# Patient Record
Sex: Female | Born: 1950 | Race: Black or African American | Hispanic: No | Smoking: Never smoker
Health system: Southern US, Community
[De-identification: ages and names within clinical notes are randomized; demographics above are authoritative.]

## PROBLEM LIST (undated history)

## (undated) DIAGNOSIS — D219 Benign neoplasm of connective and other soft tissue, unspecified: Secondary | ICD-10-CM

## (undated) HISTORY — PX: VAGINAL HYSTERECTOMY: SUR661

## (undated) HISTORY — PX: OTHER SURGICAL HISTORY: SHX169

## (undated) HISTORY — PX: COSMETIC SURGERY: SHX468

## (undated) HISTORY — DX: Benign neoplasm of connective and other soft tissue, unspecified: D21.9

---

## 1998-10-14 ENCOUNTER — Inpatient Hospital Stay (HOSPITAL_COMMUNITY): Admission: AD | Admit: 1998-10-14 | Discharge: 1998-10-14 | Payer: Self-pay | Admitting: Family Medicine

## 1998-12-16 ENCOUNTER — Encounter (INDEPENDENT_AMBULATORY_CARE_PROVIDER_SITE_OTHER): Payer: Self-pay | Admitting: Specialist

## 1998-12-16 ENCOUNTER — Other Ambulatory Visit: Admission: RE | Admit: 1998-12-16 | Discharge: 1998-12-16 | Payer: Self-pay | Admitting: *Deleted

## 1999-05-07 ENCOUNTER — Emergency Department (HOSPITAL_COMMUNITY): Admission: EM | Admit: 1999-05-07 | Discharge: 1999-05-07 | Payer: Self-pay | Admitting: Emergency Medicine

## 1999-05-22 ENCOUNTER — Other Ambulatory Visit: Admission: RE | Admit: 1999-05-22 | Discharge: 1999-05-22 | Payer: Self-pay | Admitting: *Deleted

## 1999-11-01 ENCOUNTER — Ambulatory Visit (HOSPITAL_COMMUNITY): Admission: RE | Admit: 1999-11-01 | Discharge: 1999-11-01 | Payer: Self-pay | Admitting: *Deleted

## 1999-11-01 ENCOUNTER — Encounter: Payer: Self-pay | Admitting: *Deleted

## 2000-03-12 ENCOUNTER — Inpatient Hospital Stay (HOSPITAL_COMMUNITY): Admission: AD | Admit: 2000-03-12 | Discharge: 2000-03-12 | Payer: Self-pay | Admitting: *Deleted

## 2000-04-02 ENCOUNTER — Encounter (HOSPITAL_COMMUNITY): Admission: RE | Admit: 2000-04-02 | Discharge: 2000-07-01 | Payer: Self-pay | Admitting: *Deleted

## 2000-08-20 ENCOUNTER — Other Ambulatory Visit: Admission: RE | Admit: 2000-08-20 | Discharge: 2000-08-20 | Payer: Self-pay | Admitting: Obstetrics and Gynecology

## 2000-08-21 ENCOUNTER — Other Ambulatory Visit: Admission: RE | Admit: 2000-08-21 | Discharge: 2000-08-21 | Payer: Self-pay | Admitting: Obstetrics and Gynecology

## 2000-08-21 ENCOUNTER — Encounter (INDEPENDENT_AMBULATORY_CARE_PROVIDER_SITE_OTHER): Payer: Self-pay

## 2000-10-01 ENCOUNTER — Encounter (INDEPENDENT_AMBULATORY_CARE_PROVIDER_SITE_OTHER): Payer: Self-pay | Admitting: Specialist

## 2000-10-01 ENCOUNTER — Inpatient Hospital Stay (HOSPITAL_COMMUNITY): Admission: RE | Admit: 2000-10-01 | Discharge: 2000-10-02 | Payer: Self-pay | Admitting: Obstetrics and Gynecology

## 2000-11-18 ENCOUNTER — Encounter: Admission: RE | Admit: 2000-11-18 | Discharge: 2000-11-18 | Payer: Self-pay | Admitting: Obstetrics and Gynecology

## 2000-11-18 ENCOUNTER — Encounter: Payer: Self-pay | Admitting: Obstetrics and Gynecology

## 2001-01-22 ENCOUNTER — Other Ambulatory Visit: Admission: RE | Admit: 2001-01-22 | Discharge: 2001-01-22 | Payer: Self-pay | Admitting: Obstetrics and Gynecology

## 2001-11-21 ENCOUNTER — Encounter: Payer: Self-pay | Admitting: Obstetrics and Gynecology

## 2001-11-21 ENCOUNTER — Ambulatory Visit (HOSPITAL_COMMUNITY): Admission: RE | Admit: 2001-11-21 | Discharge: 2001-11-21 | Payer: Self-pay | Admitting: Obstetrics and Gynecology

## 2002-09-17 ENCOUNTER — Other Ambulatory Visit: Admission: RE | Admit: 2002-09-17 | Discharge: 2002-09-17 | Payer: Self-pay | Admitting: *Deleted

## 2003-09-17 ENCOUNTER — Encounter: Admission: RE | Admit: 2003-09-17 | Discharge: 2003-09-17 | Payer: Self-pay | Admitting: Obstetrics and Gynecology

## 2004-06-19 ENCOUNTER — Other Ambulatory Visit: Admission: RE | Admit: 2004-06-19 | Discharge: 2004-06-19 | Payer: Self-pay | Admitting: Obstetrics and Gynecology

## 2004-09-20 ENCOUNTER — Encounter: Admission: RE | Admit: 2004-09-20 | Discharge: 2004-09-20 | Payer: Self-pay | Admitting: Obstetrics and Gynecology

## 2004-09-26 ENCOUNTER — Encounter: Admission: RE | Admit: 2004-09-26 | Discharge: 2004-09-26 | Payer: Self-pay | Admitting: Obstetrics and Gynecology

## 2005-11-28 ENCOUNTER — Encounter: Admission: RE | Admit: 2005-11-28 | Discharge: 2005-11-28 | Payer: Self-pay | Admitting: Obstetrics and Gynecology

## 2005-12-27 ENCOUNTER — Other Ambulatory Visit: Admission: RE | Admit: 2005-12-27 | Discharge: 2005-12-27 | Payer: Self-pay | Admitting: Obstetrics and Gynecology

## 2006-04-24 ENCOUNTER — Encounter: Admission: RE | Admit: 2006-04-24 | Discharge: 2006-04-24 | Payer: Self-pay | Admitting: Family Medicine

## 2006-12-04 ENCOUNTER — Encounter: Admission: RE | Admit: 2006-12-04 | Discharge: 2006-12-04 | Payer: Self-pay | Admitting: Obstetrics and Gynecology

## 2007-09-16 ENCOUNTER — Ambulatory Visit (HOSPITAL_COMMUNITY): Admission: RE | Admit: 2007-09-16 | Discharge: 2007-09-16 | Payer: Self-pay | Admitting: Family Medicine

## 2007-12-08 ENCOUNTER — Encounter: Admission: RE | Admit: 2007-12-08 | Discharge: 2007-12-08 | Payer: Self-pay | Admitting: Obstetrics and Gynecology

## 2008-12-23 ENCOUNTER — Encounter: Admission: RE | Admit: 2008-12-23 | Discharge: 2008-12-23 | Payer: Self-pay | Admitting: Obstetrics and Gynecology

## 2010-02-07 ENCOUNTER — Ambulatory Visit (HOSPITAL_COMMUNITY): Admission: RE | Admit: 2010-02-07 | Discharge: 2010-02-07 | Payer: Self-pay | Admitting: Obstetrics and Gynecology

## 2010-05-14 ENCOUNTER — Encounter: Payer: Self-pay | Admitting: Obstetrics and Gynecology

## 2010-09-08 NOTE — H&P (Signed)
[facility]  Patient:    Faith Freeman, Faith Freeman                        MRN: [id number] Adm. Date:  [id number] Disc. Date: [id number] Attending:  Shaune Spittle                         History and Physical  DATE OF SURGERY:  October 01, 2000.  HISTORY OF PRESENT ILLNESS:  The patient is a 60 year old female, para 3-0-1-3, who presents for vaginal hysterectomy.  The patient has been followed at [facility] for management of her fibroids and menorrhagia.  She had an endometrial biopsy performed that showed benign endometrial tissue.  Her most recent Pap smear was benign.  A GC and chlamydia culture were negative.  Her mammogram was within normal limits.  An ultrasound was performed that showed a 13.7 x 6.2 cm uterus with multiple fibroids.  The patients hemoglobin has been as low as 8.6.  She has taken iron for at least 3 months.   She wants to proceed at this point with definitive therapy.  The patient has had one C-section.  She has also had a dilatation and curettage performed in the past.  She has been diagnosed with syphilis but was appropriately treated.  OBSTETRICAL HISTORY:  The patient has had 3 full term deliveries and there was one set of twins.  One infant died, however, at 6 months gestation.  The patient had one elective pregnancy termination.  PAST MEDICAL HISTORY:  The patient has a history of substance abuse.  She is recovered and is doing very well at this time.  She has a history of anemia as mentioned above.  DRUG ALLERGIES:  None known.  SOCIAL HISTORY:  The patient smokes cigarettes.  She denies other recreational drug uses at this point.  She denies alcohol use.  REVIEW OF SYSTEMS:  The patient has urinary incontinence.  She wears glasses. She has had all of her teeth removed and she has dentures.  CURRENT MEDICATIONS: 1. Neurontin 1200 mg each day. 2. Celexa 20 mg each day.  FAMILY HISTORY:   The patient has a family history of asthma, sickle cell trait and headaches.  PHYSICAL EXAMINATION:  VITAL SIGNS:  WEight is 230 pounds.  HEENT:  Within normal limits.  CHEST:  Clear.  HEART:  Regular rate and rhythm.  BREASTS:  Without masses.  ABDOMEN:  Nontender.  BACK:  No CVA tenderness.  EXTREMITIES:  Within normal limits.  NEUROLOGIC:  Normal.  PELVIC EXAM:  External genitalia is normal.  The vagina is normal.  Cervix is nontender.  Uterus is 14 week size and irregular.  Adnexa:  No masses. Rectovaginal exam confirms.  ASSESSMENT: Fibroid uterus. 2. Menorrhagia. 3. Dysmenorrhea. 4. Anemia. 5. Obesity.  PLAN:  The patient will undergo a vaginal hysterectomy.  She understands the indications for her procedure and she accepts the risk of, but not limited to, anesthetic complications, bleeding, infections, and possible damage to the surrounding organs. DD:  09/30/00 TD:  09/30/00 Job: [id number]

## 2010-09-08 NOTE — Op Note (Signed)
[facility]  Patient:    Faith Freeman, Faith Freeman                        MRN: [id number] Proc. Date: 10/01/00 Adm. Date:  [id number] Disc. Date: [id number] Attending:  Leonard Schwartz                           Operative Report  PREOPERATIVE DIAGNOSES:         1. Fourteen week size fibroid uterus.                                 2. Menorrhagia.                                 3. Dysmenorrhea.                                 4. Anemia.                                 5. Obesity (weight 230 pounds).  POSTOPERATIVE DIAGNOSES:        1. Fourteen week size fibroid uterus.                                 2. Menorrhagia.                                 3. Dysmenorrhea.                                 4. Anemia.                                 5. Obesity (weight 230 pounds).                                 6. Left ovarian cyst.  OPERATION:                      Vaginal hysterectomy with uterine morcellation.  SURGEON:                        Janine Limbo, M.D.  FIRST ASSISTANT:                Henreitta Leber, P.A.-C.  ANESTHESIA:                     General.  DISPOSITION:                    Faith Freeman is a 60 year old female, para 3-0-1-3, who presents with the above mentioned diagnosis.  She understands the indications for her surgical procedure and she accepts the risks of, but not limited to, anesthetic complications, bleeding, infections, and possible damage to the surrounding organs.  FINDINGS:  The patients uterus was 14-16 weeks size. It was firm, irregular, and it contained multiple fibroids.  The left ovary contained a 3 cm clear cyst.  The fallopian tubes appeared normal.  DESCRIPTION OF PROCEDURE:       The patient was taken to the operating room where a general anesthetic was given.  The patients abdomen, perineum, and vagina were prepped with multiple layers of Betadine.  A Foley catheter was placed in the bladder.   The patient was sterilely draped.  The cervix was injected with a diluted solution of Pitressin and saline.  A circumferential incision was made around the cervix.  The mucosa was advanced both anteriorly and posteriorly.  The anterior and then the posterior cul-de-sacs were entered.  Alternating from left to right, the uterosacral ligaments, paracervical tissues, parametrial tissues, and the uterine arteries were clamped, cut, sutured, and tied securely.  Because of the large size of the uterus, we were unable to invert the uterus through the posterior colpotomy. For that reason, the uterus was then systematically morcellated until we were able to remove it in its entirety.  The upper pedicles were then clamped and cut.  The uterus was removed from the operative field.  The upper pedicles were secured using first free ties and then tie sutures ligatures.  Hemostasis was adequate at this point.  Brisk bleeding was encountered as we morcellated the uterus, however.  The sutures attached to the uterosacral ligaments were brought out through the vaginal angles and tied securely.  A McCall culdoplasty suture was placed in the posterior cul-de-sac incorporating the uterosacral ligaments bilaterally and then the posterior peritoneum.  A final check was made for hemostasis and again hemostasis was noted to be adequate. The apex of the vagina was closed incorporating figure-of-eight sutures by closing the anterior vaginal mucosa, the anterior peritoneum, the posterior peritoneum and then the posterior vaginal mucosa.  The McCall culdoplasty suture was tied securely and the apex of the vagina was noted to elevate into the mid pelvis.  Sponge, needle, and instrument counts were correct.  The estimated blood loss was 900 cc.  The patient tolerated her procedure well. She was awakened from her anesthetic and taken to the recovery room in stable condition. DD:  10/02/00 TD:  10/02/00 Job:  [id number]

## 2011-03-05 ENCOUNTER — Other Ambulatory Visit: Payer: Self-pay | Admitting: Obstetrics and Gynecology

## 2011-03-05 DIAGNOSIS — Z1231 Encounter for screening mammogram for malignant neoplasm of breast: Secondary | ICD-10-CM

## 2011-03-06 ENCOUNTER — Ambulatory Visit (INDEPENDENT_AMBULATORY_CARE_PROVIDER_SITE_OTHER): Payer: Self-pay | Admitting: *Deleted

## 2011-03-06 ENCOUNTER — Ambulatory Visit: Payer: Self-pay

## 2011-03-06 ENCOUNTER — Encounter: Payer: Self-pay | Admitting: *Deleted

## 2011-03-06 ENCOUNTER — Ambulatory Visit (HOSPITAL_COMMUNITY)
Admission: RE | Admit: 2011-03-06 | Discharge: 2011-03-06 | Disposition: A | Discharge status: Home / Self Care | Payer: Self-pay | Source: Ambulatory Visit | Attending: Obstetrics and Gynecology | Admitting: Obstetrics and Gynecology

## 2011-03-06 DIAGNOSIS — N898 Other specified noninflammatory disorders of vagina: Secondary | ICD-10-CM

## 2011-03-06 DIAGNOSIS — Z01419 Encounter for gynecological examination (general) (routine) without abnormal findings: Secondary | ICD-10-CM

## 2011-03-06 DIAGNOSIS — Z1231 Encounter for screening mammogram for malignant neoplasm of breast: Secondary | ICD-10-CM

## 2011-03-06 NOTE — Patient Instructions (Signed)
Taught patient how to perform BSE and gave educational materials to take home. Patient has had a hysterectomy. Told patient if this Pap smear comes back normal that she would not need any further Pap smears. Per patient her hysterectomy was related to fibroids. Patient escorted to mammography for a screening mammogram. Let patient know will follow up with her within the next couple weeks with results. Patient verbalized understanding.

## 2011-03-06 NOTE — Progress Notes (Signed)
No complaints today.  Pap Smear:    Completed Pap smear today. Last Pap smear was 3 years ago per patient. Per patient she has no history of abnormal Pap smears. No Pap smear results are in EPIC. Patient has had a hysterectomy for fibroids. If today's Pap smear comes back normal patient will not need any further Pap smears per BCCCP guidelines.    Physical exam: Breasts Breasts symmetrical. No skin abnormalities bilateral breasts. No nipple retraction bilateral breasts. No nipple discharge bilateral breasts. No lymphadenopathy. No lumps palpated bilateral breasts.          Pelvic/Bimanual   Ext Genitalia No lesions, no swelling and no discharge observed on external genitalia.         Vagina Vagina pink and normal texture. No lesions in vagina. Thick white vaginal discharge. Wet prep completed today.          Cervix Cervix is not present due to having a partial hysterectomy.          Uterus Uterus is not present due to having a partial hysterectomy.      Adnexae Bilateral ovaries present and palpable. No tenderness on palpation.        Rectovaginal No rectal exam completed today since patient had no rectal complaints. No skin abnormalities observed on exam.

## 2011-03-08 ENCOUNTER — Telehealth: Payer: Self-pay | Admitting: *Deleted

## 2011-03-08 LAB — WET PREP, GENITAL: WBC, Wet Prep HPF POC: NONE SEEN

## 2011-03-08 MED ORDER — METRONIDAZOLE 500 MG PO TABS: 500.0000 mg | ORAL_TABLET | Freq: Three times a day (TID) | ORAL | Status: AC

## 2011-03-08 NOTE — Telephone Encounter (Signed)
Received patients Pap smear and wet prep result. Attempted to call patient to give results and let her know a prescription was sent to her pharmacy due to her wet prep showed BV. Left voicemail for patient to call me back.

## 2011-03-08 NOTE — Telephone Encounter (Signed)
Patient called me back. Let patient know her Pap smear was normal. Let patient know her wet prep showed BV and that a prescription of Flagyl has been sent to [facility]. Informed patient not to drink alcohol while taking Flagyl. Told patient if has any problem getting medication to give me a call. Patient verbalized understanding.

## 2011-03-08 NOTE — Progress Notes (Signed)
Addended by: Catalina Antigua on: 03/08/2011 10:00 AM   Modules accepted: Orders

## 2011-03-26 ENCOUNTER — Encounter: Payer: Self-pay | Admitting: Obstetrics and Gynecology

## 2012-01-30 ENCOUNTER — Other Ambulatory Visit: Payer: Self-pay | Admitting: Obstetrics and Gynecology

## 2012-02-08 ENCOUNTER — Other Ambulatory Visit: Payer: Self-pay | Admitting: Obstetrics and Gynecology

## 2012-02-08 DIAGNOSIS — Z1231 Encounter for screening mammogram for malignant neoplasm of breast: Secondary | ICD-10-CM

## 2012-03-04 ENCOUNTER — Ambulatory Visit (HOSPITAL_COMMUNITY)
Admission: RE | Admit: 2012-03-04 | Discharge: 2012-03-04 | Disposition: A | Discharge status: Home / Self Care | Payer: Self-pay | Source: Ambulatory Visit | Attending: Obstetrics and Gynecology | Admitting: Obstetrics and Gynecology

## 2012-03-04 DIAGNOSIS — Z1231 Encounter for screening mammogram for malignant neoplasm of breast: Secondary | ICD-10-CM

## 2013-04-27 ENCOUNTER — Other Ambulatory Visit (HOSPITAL_COMMUNITY): Payer: Self-pay | Admitting: *Deleted

## 2013-04-27 DIAGNOSIS — Z1231 Encounter for screening mammogram for malignant neoplasm of breast: Secondary | ICD-10-CM

## 2013-05-05 ENCOUNTER — Ambulatory Visit (HOSPITAL_COMMUNITY)
Admission: RE | Admit: 2013-05-05 | Discharge: 2013-05-05 | Disposition: A | Discharge status: Home / Self Care | Payer: Self-pay | Source: Ambulatory Visit | Attending: *Deleted | Admitting: *Deleted

## 2013-05-05 DIAGNOSIS — Z1231 Encounter for screening mammogram for malignant neoplasm of breast: Secondary | ICD-10-CM

## 2013-08-20 ENCOUNTER — Other Ambulatory Visit (HOSPITAL_COMMUNITY): Payer: Self-pay | Admitting: Internal Medicine

## 2013-08-20 ENCOUNTER — Ambulatory Visit (HOSPITAL_COMMUNITY)
Admission: RE | Admit: 2013-08-20 | Discharge: 2013-08-20 | Disposition: A | Discharge status: Home / Self Care | Payer: No Typology Code available for payment source | Source: Ambulatory Visit | Attending: Internal Medicine | Admitting: Internal Medicine

## 2013-08-20 DIAGNOSIS — M19049 Primary osteoarthritis, unspecified hand: Secondary | ICD-10-CM | POA: Insufficient documentation

## 2013-08-20 DIAGNOSIS — M79609 Pain in unspecified limb: Secondary | ICD-10-CM | POA: Insufficient documentation

## 2013-08-20 DIAGNOSIS — R52 Pain, unspecified: Secondary | ICD-10-CM

## 2013-08-20 DIAGNOSIS — M7989 Other specified soft tissue disorders: Secondary | ICD-10-CM | POA: Insufficient documentation

## 2014-02-22 ENCOUNTER — Encounter: Payer: Self-pay | Admitting: *Deleted

## 2015-06-04 ENCOUNTER — Ambulatory Visit (INDEPENDENT_AMBULATORY_CARE_PROVIDER_SITE_OTHER): Payer: Self-pay | Admitting: Family Medicine

## 2015-06-04 VITALS — BP 132/72 | HR 71 | Temp 98.9°F | Resp 16 | Ht 63.0 in | Wt 204.0 lb

## 2015-06-04 DIAGNOSIS — H9203 Otalgia, bilateral: Secondary | ICD-10-CM

## 2015-06-04 DIAGNOSIS — J0110 Acute frontal sinusitis, unspecified: Secondary | ICD-10-CM

## 2015-06-04 DIAGNOSIS — J309 Allergic rhinitis, unspecified: Secondary | ICD-10-CM

## 2015-06-04 DIAGNOSIS — J029 Acute pharyngitis, unspecified: Secondary | ICD-10-CM

## 2015-06-04 MED ORDER — BENZONATATE 100 MG PO CAPS: 200.0000 mg | ORAL_CAPSULE | Freq: Two times a day (BID) | ORAL | Status: DC | PRN

## 2015-06-04 MED ORDER — AMOXICILLIN-POT CLAVULANATE 875-125 MG PO TABS: 1.0000 | ORAL_TABLET | Freq: Two times a day (BID) | ORAL | Status: DC

## 2015-06-04 NOTE — Progress Notes (Signed)
Chief Complaint:  Chief Complaint  Patient presents with  . Sore Throat    x 3 days   . Headache  . Ear Pain    both  . discomfort in chest    upper near throat, x 1 day     HPI: Faith Freeman is a 65 y.o. female who reports to [facility] today complaining of sore throat,e ar ache, headache, ear bilaterally m dizziness and muffled feeling.   She has tried listerine and salt water. Sh eis not around any kids, she goes the gym every day. Sh ehas had some chills, some msk aches, and no fever.   Past Medical History  Diagnosis Date  . Fibroids    Past Surgical History  Procedure Laterality Date  . Vaginal hysterectomy    . Abdominalplasty    . Cosmetic surgery     Social History   Social History  . Marital Status: Divorced    Spouse Name: N/A  . Number of Children: N/A  . Years of Education: N/A   Social History Main Topics  . Smoking status: Never Smoker   . Smokeless tobacco: Never Used  . Alcohol Use: No  . Drug Use: No  . Sexual Activity: No   Other Topics Concern  . None   Social History Narrative   Family History  Problem Relation Age of Onset  . Heart disease Maternal Grandmother   . Diabetes Mother   . Hypertension Mother   . Cancer Mother     breast  . Diabetes Brother   . Hypertension Brother   . Diabetes Brother    No Known Allergies Prior to Admission medications   Medication Sig Start Date End Date Taking? Authorizing Provider  diphenhydrAMINE (BENADRYL) 25 MG tablet Take 25 mg by mouth every 6 (six) hours as needed.   Yes Historical Provider, MD  loratadine (CLARITIN) 10 MG tablet Take 10 mg by mouth daily.   Yes Historical Provider, MD     ROS: The patient denies fevers, chills, night sweats, unintentional weight loss, chest pain, palpitations, wheezing, dyspnea on exertion, nausea, vomiting, abdominal pain, dysuria, hematuria, melena, numbness, weakness, or tingling.  All other systems have been reviewed and were otherwise negative  with the exception of those mentioned in the HPI and as above.    PHYSICAL EXAM: Filed Vitals:   06/04/15 1357  BP: 132/72  Pulse: 71  Temp: 98.9 F (37.2 C)  Resp: 16   Body mass index is 36.15 kg/(m^2).   General: Alert, no acute distress HEENT:  Normocephalic, atraumatic, oropharynx patent. EOMI, PERRLA LEft tm erythema, + sinus tenderenss, neg exudates, neg tonsils  Cardiovascular:  Regular rate and rhythm, no rubs murmurs or gallops.  Respiratory: Clear to auscultation bilaterally.  No wheezes, rales, or rhonchi.  No cyanosis, no use of accessory musculature Abdominal: No organomegaly, abdomen is soft and non-tender, positive bowel sounds. No masses. Skin: No rashes. Neurologic: Facial musculature symmetric. Psychiatric: Patient acts appropriately throughout our interaction. Lymphatic: No cervical or submandibular lymphadenopathy Musculoskeletal: Gait intact. No edema, tenderness   LABS:   EKG/XRAY:   Primary read interpreted by Dr. Marin Comment at [facility].   ASSESSMENT/PLAN: Encounter Diagnoses  Name Primary?  . Otalgia of both ears   . Acute pharyngitis, unspecified pharyngitis type   . Allergic rhinitis, unspecified allergic rhinitis type   . Acute frontal sinusitis, recurrence not specified Yes   Rx augmentin and tessalon perles  Cont with allergy otc meds Fu prn  Gross sideeffects, risk and benefits, and alternatives of medications d/w patient. Patient is aware that all medications have potential sideeffects and we are unable to predict every sideeffect or drug-drug interaction that may occur.  Kainen Struckman DO  06/04/2015 2:43 PM

## 2015-06-04 NOTE — Patient Instructions (Signed)

## 2015-09-13 ENCOUNTER — Other Ambulatory Visit: Payer: Self-pay | Admitting: Primary Care

## 2015-09-13 DIAGNOSIS — Z1231 Encounter for screening mammogram for malignant neoplasm of breast: Secondary | ICD-10-CM

## 2015-09-22 ENCOUNTER — Ambulatory Visit
Admission: RE | Admit: 2015-09-22 | Discharge: 2015-09-22 | Disposition: A | Discharge status: Home / Self Care | Payer: No Typology Code available for payment source | Source: Ambulatory Visit | Attending: Primary Care | Admitting: Primary Care

## 2015-09-22 DIAGNOSIS — Z1231 Encounter for screening mammogram for malignant neoplasm of breast: Secondary | ICD-10-CM

## 2016-03-05 ENCOUNTER — Emergency Department (HOSPITAL_COMMUNITY): Payer: Self-pay

## 2016-03-05 ENCOUNTER — Emergency Department (HOSPITAL_COMMUNITY)
Admission: EM | Admit: 2016-03-05 | Discharge: 2016-03-05 | Disposition: A | Discharge status: Home / Self Care | Payer: Self-pay | Attending: Physician Assistant | Admitting: Physician Assistant

## 2016-03-05 ENCOUNTER — Encounter (HOSPITAL_COMMUNITY): Payer: Self-pay | Admitting: Emergency Medicine

## 2016-03-05 DIAGNOSIS — R0789 Other chest pain: Secondary | ICD-10-CM | POA: Insufficient documentation

## 2016-03-05 DIAGNOSIS — M940 Chondrocostal junction syndrome [Tietze]: Secondary | ICD-10-CM | POA: Insufficient documentation

## 2016-03-05 LAB — BASIC METABOLIC PANEL
Anion gap: 8 (ref 5–15)
BUN: 14 mg/dL (ref 6–20)
CO2: 27 mmol/L (ref 22–32)
Calcium: 9.1 mg/dL (ref 8.9–10.3)
Chloride: 104 mmol/L (ref 101–111)
Creatinine, Ser: 0.75 mg/dL (ref 0.44–1.00)
GFR calc Af Amer: >60 mL/min (ref >60)
GLUCOSE: 111 mg/dL — AB (ref 65–99)
POTASSIUM: 3.8 mmol/L (ref 3.5–5.1)
Sodium: 139 mmol/L (ref 135–145)

## 2016-03-05 LAB — CBC
HEMATOCRIT: 37.7 % (ref 36.0–46.0)
Hemoglobin: 13 g/dL (ref 12.0–15.0)
MCH: 29.7 pg (ref 26.0–34.0)
MCHC: 34.5 g/dL (ref 30.0–36.0)
MCV: 86.3 fL (ref 78.0–100.0)
Platelets: 282 10*3/uL (ref 150–400)
RBC: 4.37 MIL/uL (ref 3.87–5.11)
RDW: 13.1 % (ref 11.5–15.5)
WBC: 5.2 10*3/uL (ref 4.0–10.5)

## 2016-03-05 LAB — I-STAT TROPONIN, ED
Troponin i, poc: 0 ng/mL (ref 0.00–0.08)
Troponin i, poc: 0 ng/mL (ref 0.00–0.08)

## 2016-03-05 MED ORDER — CYCLOBENZAPRINE HCL 10 MG PO TABS: 10.0000 mg | ORAL_TABLET | Freq: Two times a day (BID) | ORAL | 0 refills | Status: DC | PRN

## 2016-03-05 MED ORDER — IBUPROFEN 600 MG PO TABS: 600.0000 mg | ORAL_TABLET | Freq: Four times a day (QID) | ORAL | 0 refills | Status: AC | PRN

## 2016-03-05 NOTE — ED Notes (Signed)
Pt changing into gown. Instructed to call out when ready to be seen.

## 2016-03-05 NOTE — ED Provider Notes (Signed)
[facility] DEPT Provider Note   CSN: [id number] Arrival date & time: 03/05/16  1901  History   Chief Complaint Chief Complaint  Patient presents with  . Chest Pain   HPI Faith Freeman is a 65 y.o. female.   Chest Pain   This is a new problem. The current episode started more than 2 days ago. The problem occurs constantly. The problem has not changed since onset.The pain is associated with coughing and breathing. The pain is present in the substernal region. The pain is at a severity of 6/10. The pain is moderate. The quality of the pain is described as sharp. The pain radiates to the right shoulder. Associated symptoms include back pain and cough. Pertinent negatives include no abdominal pain, no diaphoresis, no exertional chest pressure, no fever, no hemoptysis, no irregular heartbeat, no lower extremity edema, no nausea, no near-syncope, no orthopnea, no palpitations, no shortness of breath, no sputum production, no vomiting and no weakness.    Past Medical History:  Diagnosis Date  . Fibroids     There are no active problems to display for this patient.   Past Surgical History:  Procedure Laterality Date  . abdominalplasty    . COSMETIC SURGERY    . VAGINAL HYSTERECTOMY      OB History    Gravida Para Term Preterm AB Living   5 3     2 4    SAB TAB Ectopic Multiple Live Births     2   1         Home Medications    Prior to Admission medications   Medication Sig Start Date End Date Taking? Authorizing Provider  amoxicillin-clavulanate (AUGMENTIN) 875-125 MG tablet Take 1 tablet by mouth 2 (two) times daily. 06/04/15   Thao P Le, DO  benzonatate (TESSALON) 100 MG capsule Take 2 capsules (200 mg total) by mouth 2 (two) times daily as needed. 06/04/15   Thao P Le, DO  diphenhydrAMINE (BENADRYL) 25 MG tablet Take 25 mg by mouth every 6 (six) hours as needed.    Historical Provider, MD  loratadine (CLARITIN) 10 MG tablet Take 10 mg by mouth daily.    Historical  Provider, MD    Family History Family History  Problem Relation Age of Onset  . Diabetes Mother   . Hypertension Mother   . Cancer Mother     breast  . Diabetes Brother   . Hypertension Brother   . Diabetes Brother   . Heart disease Maternal Grandmother     Social History Social History  Substance Use Topics  . Smoking status: Never Smoker  . Smokeless tobacco: Never Used  . Alcohol use No     Allergies   Patient has no known allergies.   Review of Systems Review of Systems  Constitutional: Negative for diaphoresis and fever.  Respiratory: Positive for cough. Negative for hemoptysis, sputum production and shortness of breath.   Cardiovascular: Positive for chest pain. Negative for palpitations, orthopnea and near-syncope.  Gastrointestinal: Negative for abdominal pain, nausea and vomiting.  Musculoskeletal: Positive for back pain.  Neurological: Negative for weakness.  All other systems reviewed and are negative.  Physical Exam Updated Vital Signs BP 117/76 (BP Location: Left Arm)   Pulse 80   Temp 98.2 F (36.8 C) (Oral)   Resp 16   Ht 5' 3.5" (1.613 m)   Wt 93.4 kg   SpO2 99%   BMI 35.92 kg/m   Physical Exam  Constitutional: She is oriented to  person, place, and time. She appears well-developed and well-nourished. No distress.  HENT:  Head: Normocephalic and atraumatic.  Eyes: Pupils are equal, round, and reactive to light.  Neck: Normal range of motion. Neck supple.  Cardiovascular: Normal rate and regular rhythm.   Pulmonary/Chest: Effort normal and breath sounds normal. No respiratory distress. She has no wheezes. She has no rales.  Abdominal: Soft. She exhibits no distension and no mass. There is no tenderness. There is no rebound and no guarding.  Musculoskeletal: Normal range of motion. She exhibits no edema.  Neurological: She is alert and oriented to person, place, and time. She displays normal reflexes. No cranial nerve deficit. She exhibits  normal muscle tone. Coordination normal.  Skin: Skin is warm and dry. Capillary refill takes less than 2 seconds. She is not diaphoretic. No erythema.  Psychiatric: She has a normal mood and affect. Her behavior is normal. Thought content normal.  Nursing note and vitals reviewed.  ED Treatments / Results  Labs (all labs ordered are listed, but only abnormal results are displayed) Labs Reviewed  BASIC METABOLIC PANEL - Abnormal; Notable for the following:       Result Value   Glucose, Bld 111 (*)    All other components within normal limits  CBC  I-STAT TROPOININ, ED   EKG  EKG Interpretation None      Radiology Dg Chest 2 View  Result Date: 03/05/2016 CLINICAL DATA:  Acute onset of medial right breast pain, radiating to the back and left shoulder. Initial encounter. EXAM: CHEST  2 VIEW COMPARISON:  None. FINDINGS: The lungs are well-aerated. Peribronchial thickening is noted. There is no evidence of focal opacification, pleural effusion or pneumothorax. The heart is borderline enlarged. No acute osseous abnormalities are seen. IMPRESSION: Peribronchial thickening noted.  Borderline cardiomegaly. Electronically Signed   By: Garald Balding M.D.   On: 03/05/2016 19:43    Procedures Procedures (including critical care time)  Medications Ordered in ED Medications - No data to display   Initial Impression / Assessment and Plan / ED Course  I have reviewed the triage vital signs and the nursing notes.  Pertinent labs & imaging results that were available during my care of the patient were reviewed by me and considered in my medical decision making (see chart for details).  Clinical Course    Patient is a 66 year old female who presents to emergency department today with nonexertional sharp sternal pain is worse with palpation, movement and deep inspiration but is not associated with shortness of breath, nausea, vomiting, diaphoresis.  Patient endorses a dry nonproductive cough  but otherwise no sputum production or fever.  Patient's exam and history is consistent with costochondritis secondary to cough. She has no signs or symptoms that are consistent with ACS. She is a heart score of 1.  Patient is low Wells criteria and do not believe this to be PE. Patient is not tachycardic or tachypnea care. She is satting 99% on room air.  Troponins are negative.  Patient will follow up primary care physician encouraged to take Motrin and flexeril as needed. Patient told not to drive or operate heavy machinery while taking flexeril.   Final Clinical Impressions(s) / ED Diagnoses   Final diagnoses:  Atypical chest pain  Costochondritis, acute    New Prescriptions New Prescriptions   CYCLOBENZAPRINE (FLEXERIL) 10 MG TABLET    Take 1 tablet (10 mg total) by mouth 2 (two) times daily as needed for muscle spasms.   IBUPROFEN (ADVIL,MOTRIN) 600  MG TABLET    Take 1 tablet (600 mg total) by mouth every 6 (six) hours as needed.     Roberto Scales, MD 03/05/16 [place], MD 03/09/16 1341

## 2016-03-05 NOTE — ED Triage Notes (Signed)
Pt. Stated, I've had chest pain right in the middle for about 3 days.  I've also had a little bit of light headedness.

## 2017-02-25 ENCOUNTER — Encounter (HOSPITAL_COMMUNITY): Payer: Self-pay

## 2017-04-30 ENCOUNTER — Ambulatory Visit (HOSPITAL_COMMUNITY)
Admission: EM | Admit: 2017-04-30 | Discharge: 2017-04-30 | Disposition: A | Discharge status: Home / Self Care | Payer: Medicare Other | Attending: Internal Medicine | Admitting: Internal Medicine

## 2017-04-30 ENCOUNTER — Other Ambulatory Visit: Payer: Self-pay

## 2017-04-30 ENCOUNTER — Encounter (HOSPITAL_COMMUNITY): Payer: Self-pay | Admitting: Emergency Medicine

## 2017-04-30 DIAGNOSIS — J029 Acute pharyngitis, unspecified: Secondary | ICD-10-CM

## 2017-04-30 DIAGNOSIS — R05 Cough: Secondary | ICD-10-CM

## 2017-04-30 DIAGNOSIS — J40 Bronchitis, not specified as acute or chronic: Secondary | ICD-10-CM

## 2017-04-30 MED ORDER — PREDNISONE 20 MG PO TABS: 40.0000 mg | ORAL_TABLET | Freq: Every day | ORAL | 0 refills | Status: AC

## 2017-04-30 MED ORDER — FLUTICASONE PROPIONATE 50 MCG/ACT NA SUSP: 2.0000 | Freq: Every day | NASAL | 0 refills | Status: AC

## 2017-04-30 MED ORDER — CETIRIZINE-PSEUDOEPHEDRINE ER 5-120 MG PO TB12: 1.0000 | ORAL_TABLET | Freq: Every day | ORAL | 0 refills | Status: AC

## 2017-04-30 MED ORDER — AZITHROMYCIN 250 MG PO TABS: 250.0000 mg | ORAL_TABLET | Freq: Every day | ORAL | 0 refills | Status: AC

## 2017-04-30 NOTE — Discharge Instructions (Addendum)
Start azithromycin and prednisone as directed. Start flonase, zyrtec-D for nasal congestion. You can use over the counter nasal saline rinse such as neti pot for nasal congestion. Keep hydrated, your urine should be clear to pale yellow in color. Tylenol/motrin for fever and pain. Monitor for any worsening of symptoms, chest pain, shortness of breath, wheezing, swelling of the throat, follow up for reevaluation.   For sore throat try using a honey-based tea. Use 3 teaspoons of honey with juice squeezed from half lemon. Place shaved pieces of ginger into 1/2-1 cup of water and warm over stove top. Then mix the ingredients and repeat every 4 hours as needed.

## 2017-04-30 NOTE — ED Triage Notes (Signed)
Pt c/o coughing up mucous, fever, bilateral ear pain, sore throat since before christmas.

## 2017-04-30 NOTE — ED Provider Notes (Signed)
[place]    CSN: [id number] Arrival date & time: 04/30/17  1333     History   Chief Complaint Chief Complaint  Patient presents with  . URI    HPI Faith Freeman is a 67 y.o. female.   67 year old female comes in for 3-week history of URI symptoms.  She has had a productive cough, fever, bilateral ear pain, sore throat. Tmax 101, 2 weeks ago, states she has stopped taking temperature. Continues to have subjective fever. otc tylenol, cold medications with some relief. States continues to have rhinorrhea, nasal congestion. Former smoker, 10-15 pack year history, last smoked 2009. Never needed inhaler.       Past Medical History:  Diagnosis Date  . Fibroids     There are no active problems to display for this patient.   Past Surgical History:  Procedure Laterality Date  . abdominalplasty    . COSMETIC SURGERY    . VAGINAL HYSTERECTOMY      OB History    Gravida Para Term Preterm AB Living   5 3     2 4    SAB TAB Ectopic Multiple Live Births     2   1         Home Medications    Prior to Admission medications   Medication Sig Start Date End Date Taking? Authorizing Provider  BIOTIN PO Take 1 tablet by mouth daily.   Yes [provider]  Calcium-Magnesium-Vitamin D (CALCIUM MAGNESIUM PO) Take 1 tablet by mouth daily.   Yes [provider]  diphenhydrAMINE (BENADRYL) 25 MG tablet Take 25 mg by mouth every 6 (six) hours as needed for allergies.    Yes [provider]  ibuprofen (ADVIL,MOTRIN) 600 MG tablet Take 1 tablet (600 mg total) by mouth every 6 (six) hours as needed. 03/05/16  Yes Roberto Scales, MD  omega-3 acid ethyl esters (LOVAZA) 1 g capsule Take 1 g by mouth daily.   Yes [provider]  azithromycin (ZITHROMAX) 250 MG tablet Take 1 tablet (250 mg total) by mouth daily. Take first 2 tablets together, then 1 every day until finished. 04/30/17   Tasia Catchings, Camden Knotek V, PA-C  cetirizine-pseudoephedrine (ZYRTEC-D) 5-120 MG  tablet Take 1 tablet by mouth daily. 04/30/17   Tasia Catchings, Brayln Duque V, PA-C  fluticasone (FLONASE) 50 MCG/ACT nasal spray Place 2 sprays into both nostrils daily. 04/30/17   Tasia Catchings, Kieana Livesay V, PA-C  predniSONE (DELTASONE) 20 MG tablet Take 2 tablets (40 mg total) by mouth daily for 4 days. 04/30/17 05/04/17  Ok Edwards, PA-C    Family History Family History  Problem Relation Age of Onset  . Diabetes Mother   . Hypertension Mother   . Cancer Mother        breast  . Diabetes Brother   . Hypertension Brother   . Diabetes Brother   . Heart disease Maternal Grandmother     Social History Social History   Tobacco Use  . Smoking status: Never Smoker  . Smokeless tobacco: Never Used  Substance Use Topics  . Alcohol use: No  . Drug use: No     Allergies   Patient has no known allergies.   Review of Systems Review of Systems  Reason unable to perform ROS: See HPI as above.     Physical Exam Triage Vital Signs ED Triage Vitals [04/30/17 1350]  Enc Vitals Group     BP 137/63     Pulse Rate 60     Resp  14     Temp 98.5 F (36.9 C)     Temp src      SpO2 100 %     Weight      Height      Head Circumference      Peak Flow      Pain Score 2     Pain Loc      Pain Edu?      Excl. in [place]?    No data found.  Updated Vital Signs BP 137/63   Pulse 60   Temp 98.5 F (36.9 C)   Resp 14   SpO2 100%   Physical Exam  Constitutional: She is oriented to person, place, and time. She appears well-developed and well-nourished. No distress.  HENT:  Head: Normocephalic and atraumatic.  Right Ear: Tympanic membrane, external ear and ear canal normal. Tympanic membrane is not erythematous and not bulging.  Left Ear: Tympanic membrane, external ear and ear canal normal. Tympanic membrane is not erythematous and not bulging.  Nose: Mucosal edema and rhinorrhea present. Right sinus exhibits no maxillary sinus tenderness and no frontal sinus tenderness. Left sinus exhibits no maxillary sinus tenderness and  no frontal sinus tenderness.  Mouth/Throat: Uvula is midline, oropharynx is clear and moist and mucous membranes are normal.  Eyes: Conjunctivae are normal. Pupils are equal, round, and reactive to light.  Neck: Normal range of motion. Neck supple.  Cardiovascular: Normal rate, regular rhythm and normal heart sounds. Exam reveals no gallop and no friction rub.  No murmur heard. Pulmonary/Chest: Effort normal and breath sounds normal. She has no decreased breath sounds. She has no wheezes. She has no rhonchi. She has no rales.  Lymphadenopathy:    She has no cervical adenopathy.  Neurological: She is alert and oriented to person, place, and time.  Skin: Skin is warm and dry.  Psychiatric: She has a normal mood and affect. Her behavior is normal. Judgment normal.     UC Treatments / Results  Labs (all labs ordered are listed, but only abnormal results are displayed) Labs Reviewed - No data to display  EKG  EKG Interpretation None       Radiology No results found.  Procedures Procedures (including critical care time)  Medications Ordered in UC Medications - No data to display   Initial Impression / Assessment and Plan / UC Course  I have reviewed the triage vital signs and the nursing notes.  Pertinent labs & imaging results that were available during my care of the patient were reviewed by me and considered in my medical decision making (see chart for details).    Will treat for bronchitis with azithromycin and prednisone.  Other symptomatic treatment discussed.  Push fluids.  Return precautions given.  Patient expresses understanding and agrees to plan.  Final Clinical Impressions(s) / UC Diagnoses   Final diagnoses:  Bronchitis    ED Discharge Orders        Ordered    azithromycin (ZITHROMAX) 250 MG tablet  Daily     04/30/17 1417    predniSONE (DELTASONE) 20 MG tablet  Daily     04/30/17 1417    cetirizine-pseudoephedrine (ZYRTEC-D) 5-120 MG tablet  Daily       04/30/17 1417    fluticasone (FLONASE) 50 MCG/ACT nasal spray  Daily     04/30/17 1417       Ok Edwards, PA-C 04/30/17 1556

## 2017-06-05 ENCOUNTER — Other Ambulatory Visit: Payer: Self-pay | Admitting: Family Medicine

## 2017-06-05 DIAGNOSIS — Z139 Encounter for screening, unspecified: Secondary | ICD-10-CM

## 2017-06-24 ENCOUNTER — Ambulatory Visit: Payer: Medicare Other

## 2017-06-24 ENCOUNTER — Ambulatory Visit
Admission: RE | Admit: 2017-06-24 | Discharge: 2017-06-24 | Disposition: A | Discharge status: Home / Self Care | Payer: Medicare Other | Source: Ambulatory Visit | Attending: Family Medicine | Admitting: Family Medicine

## 2017-06-24 DIAGNOSIS — Z139 Encounter for screening, unspecified: Secondary | ICD-10-CM

## 2018-07-28 ENCOUNTER — Other Ambulatory Visit: Payer: Self-pay | Admitting: Family Medicine

## 2018-07-28 DIAGNOSIS — Z1231 Encounter for screening mammogram for malignant neoplasm of breast: Secondary | ICD-10-CM

## 2018-09-22 ENCOUNTER — Ambulatory Visit: Payer: Medicare Other

## 2018-09-23 ENCOUNTER — Ambulatory Visit
Admission: RE | Admit: 2018-09-23 | Discharge: 2018-09-23 | Disposition: A | Discharge status: Home / Self Care | Payer: Medicare Other | Source: Ambulatory Visit | Attending: Family Medicine | Admitting: Family Medicine

## 2018-09-23 ENCOUNTER — Other Ambulatory Visit: Payer: Self-pay

## 2018-09-23 DIAGNOSIS — Z1231 Encounter for screening mammogram for malignant neoplasm of breast: Secondary | ICD-10-CM

## 2019-05-18 ENCOUNTER — Ambulatory Visit
Admission: RE | Admit: 2019-05-18 | Discharge: 2019-05-18 | Disposition: A | Discharge status: Home / Self Care | Payer: Medicare Other | Source: Ambulatory Visit | Attending: Family Medicine | Admitting: Family Medicine

## 2019-05-18 ENCOUNTER — Other Ambulatory Visit: Payer: Self-pay | Admitting: Family Medicine

## 2019-05-18 DIAGNOSIS — M79641 Pain in right hand: Secondary | ICD-10-CM

## 2019-06-15 ENCOUNTER — Ambulatory Visit: Payer: Medicare Other | Attending: Family

## 2019-06-15 DIAGNOSIS — Z23 Encounter for immunization: Secondary | ICD-10-CM | POA: Insufficient documentation

## 2019-06-15 NOTE — Progress Notes (Signed)
   Covid-19 Vaccination Clinic  Name:  Faith Freeman    MRN: [id number] DOB: 1951/03/03  06/15/2019  Faith Freeman was observed post Covid-19 immunization for 15 minutes without incidence. She was provided with Vaccine Information Sheet and instruction to access the V-Safe system.   Faith Freeman was instructed to call 911 with any severe reactions post vaccine: Marland Kitchen Difficulty breathing  . Swelling of your face and throat  . A fast heartbeat  . A bad rash all over your body  . Dizziness and weakness    Immunizations Administered    Name Date Dose VIS Date Route   Moderna COVID-19 Vaccine 06/15/2019  2:47 PM 0.5 mL 03/24/2019 Intramuscular   Manufacturer: Moderna   Lot: [id number]   [id number]

## 2019-07-21 ENCOUNTER — Ambulatory Visit: Payer: Medicare Other | Attending: Family

## 2019-07-21 DIAGNOSIS — Z23 Encounter for immunization: Secondary | ICD-10-CM

## 2019-07-21 NOTE — Progress Notes (Signed)
   Covid-19 Vaccination Clinic  Name:  Faith Freeman    MRN: [id number] DOB: 1950/09/08  07/21/2019  Faith Freeman was observed post Covid-19 immunization for 15 minutes without incident. She was provided with Vaccine Information Sheet and instruction to access the V-Safe system.   Faith Freeman was instructed to call 911 with any severe reactions post vaccine: Marland Kitchen Difficulty breathing  . Swelling of face and throat  . A fast heartbeat  . A bad rash all over body  . Dizziness and weakness   Immunizations Administered    Name Date Dose VIS Date Route   Moderna COVID-19 Vaccine 07/21/2019  4:39 PM 0.5 mL 03/24/2019 Intramuscular   Manufacturer: Moderna   Lot: [id number]   [id number]

## 2019-09-03 ENCOUNTER — Other Ambulatory Visit: Payer: Self-pay | Admitting: Family Medicine

## 2019-09-03 DIAGNOSIS — Z1231 Encounter for screening mammogram for malignant neoplasm of breast: Secondary | ICD-10-CM

## 2019-09-28 ENCOUNTER — Other Ambulatory Visit: Payer: Self-pay

## 2019-09-28 ENCOUNTER — Ambulatory Visit: Payer: Medicare Other

## 2019-09-28 ENCOUNTER — Ambulatory Visit
Admission: RE | Admit: 2019-09-28 | Discharge: 2019-09-28 | Disposition: A | Discharge status: Home / Self Care | Payer: Medicare Other | Source: Ambulatory Visit | Attending: Family Medicine | Admitting: Family Medicine

## 2019-09-28 DIAGNOSIS — Z1231 Encounter for screening mammogram for malignant neoplasm of breast: Secondary | ICD-10-CM

## 2020-08-23 ENCOUNTER — Other Ambulatory Visit: Payer: Self-pay | Admitting: Family Medicine

## 2020-08-23 DIAGNOSIS — Z1231 Encounter for screening mammogram for malignant neoplasm of breast: Secondary | ICD-10-CM

## 2020-10-14 ENCOUNTER — Other Ambulatory Visit: Payer: Self-pay

## 2020-10-14 ENCOUNTER — Ambulatory Visit
Admission: RE | Admit: 2020-10-14 | Discharge: 2020-10-14 | Disposition: A | Discharge status: Home / Self Care | Payer: Medicare Other | Source: Ambulatory Visit | Attending: Family Medicine | Admitting: Family Medicine

## 2020-10-14 DIAGNOSIS — Z1231 Encounter for screening mammogram for malignant neoplasm of breast: Secondary | ICD-10-CM

## 2021-01-11 ENCOUNTER — Other Ambulatory Visit: Payer: Self-pay | Admitting: Family Medicine

## 2021-01-11 DIAGNOSIS — Z13820 Encounter for screening for osteoporosis: Secondary | ICD-10-CM

## 2021-01-20 ENCOUNTER — Other Ambulatory Visit: Payer: Self-pay

## 2021-01-20 ENCOUNTER — Ambulatory Visit
Admission: RE | Admit: 2021-01-20 | Discharge: 2021-01-20 | Disposition: A | Discharge status: Home / Self Care | Payer: Medicare Other | Source: Ambulatory Visit | Attending: Family Medicine | Admitting: Family Medicine

## 2021-01-20 DIAGNOSIS — Z13820 Encounter for screening for osteoporosis: Secondary | ICD-10-CM

## 2021-07-17 IMAGING — MG MM DIGITAL SCREENING BILAT W/ TOMO AND CAD
8 series · 8 of 24 positions shown · non-contrast
Comparison: Previous exam(s).

CLINICAL DATA: Screening.

EXAM:
DIGITAL SCREENING BILATERAL MAMMOGRAM WITH TOMOSYNTHESIS AND CAD
TECHNIQUE: Bilateral screening digital craniocaudal and mediolateral oblique
mammograms were obtained. Bilateral screening digital breast
tomosynthesis was performed. The images were evaluated with
computer-aided detection.

[R CC synth-2D]
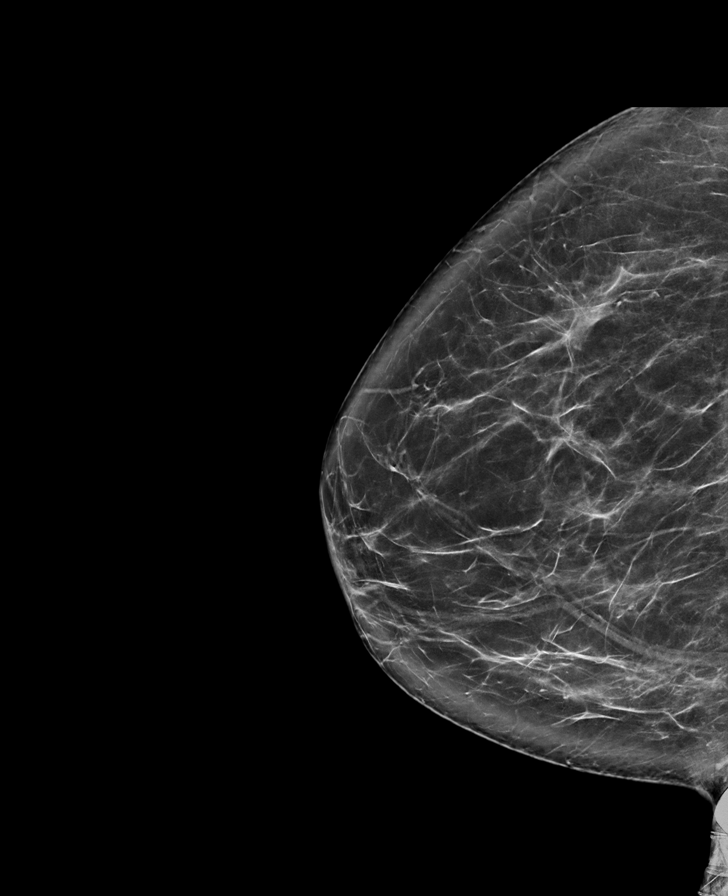

[R MLO synth-2D]
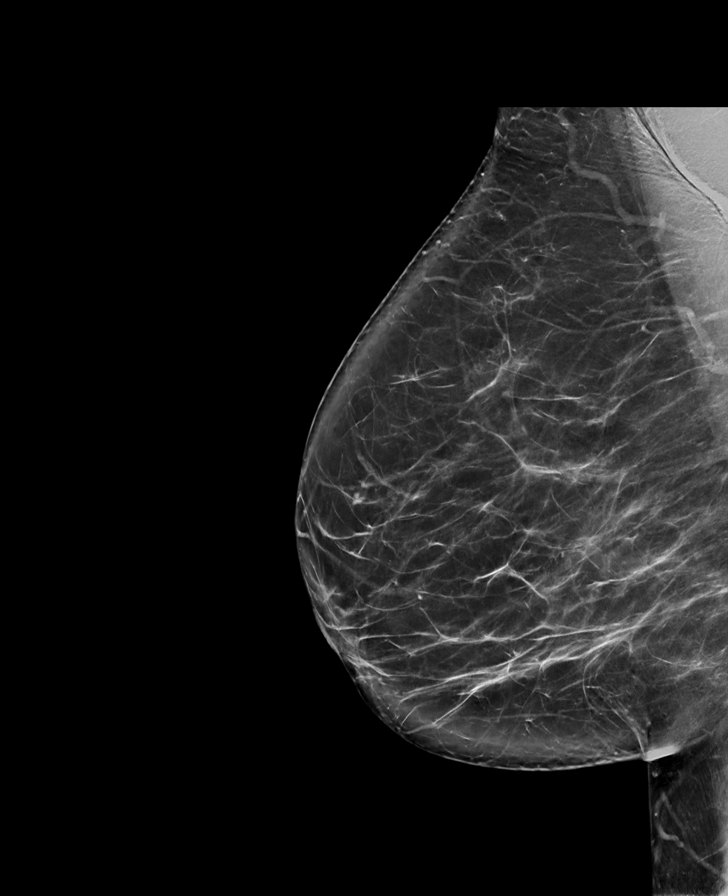

[L MLO synth-2D]
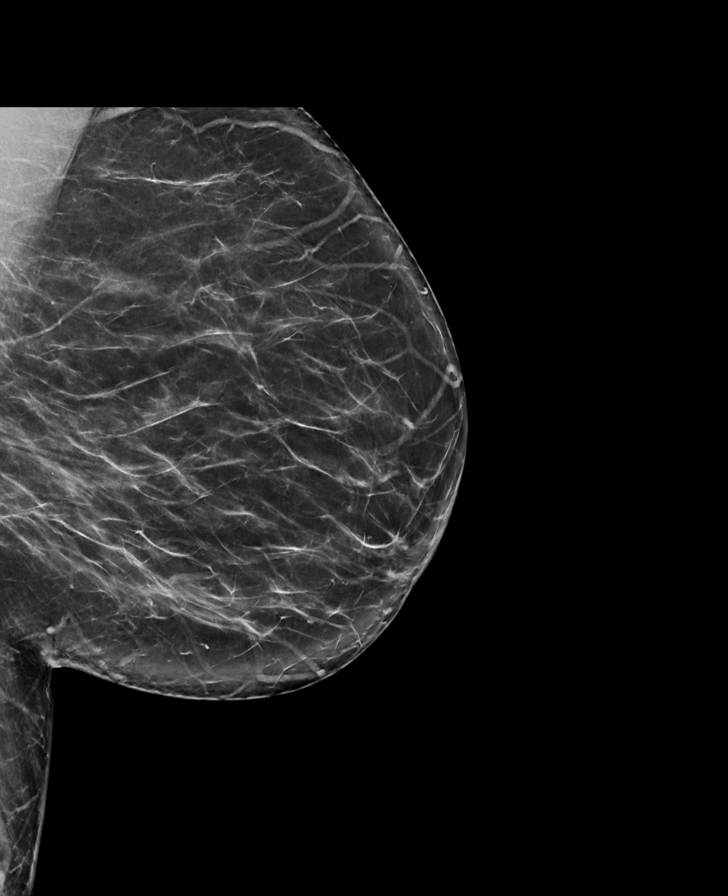

[L CC synth-2D]
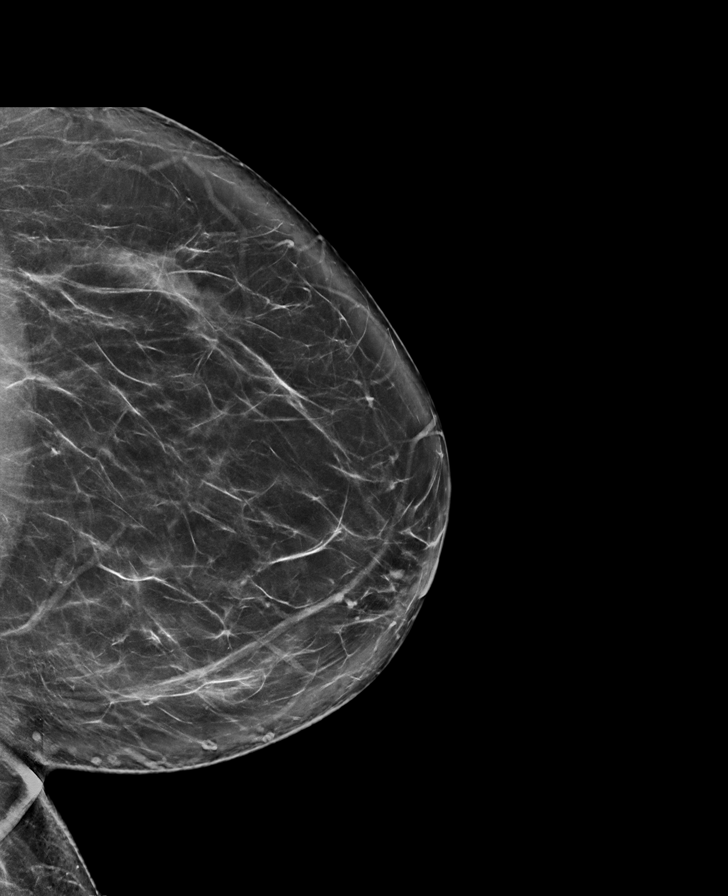

[R CC tomo · tomo slice 45/90.0]
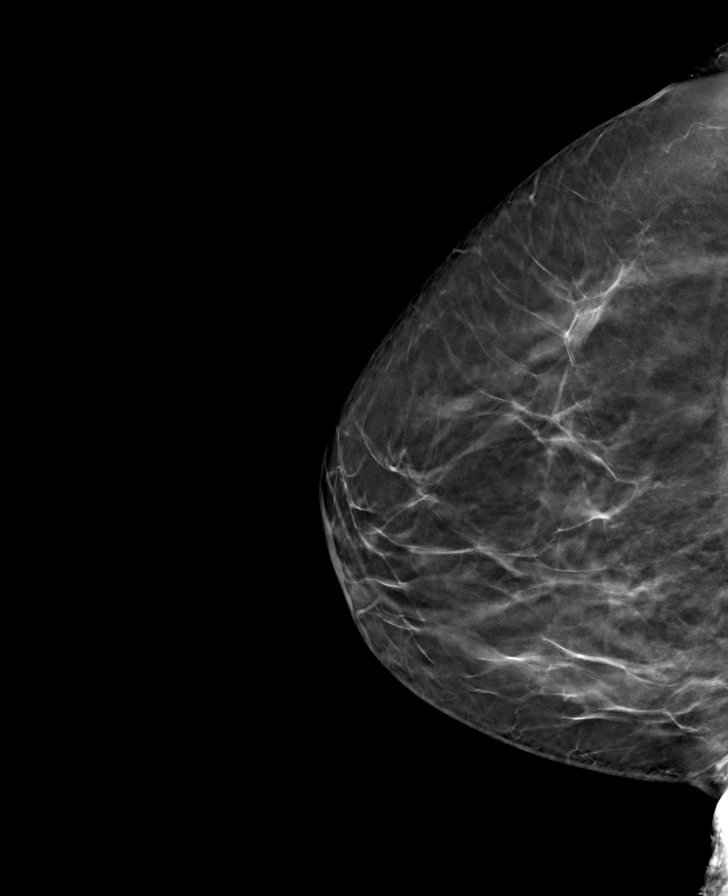

[L CC tomo · tomo slice 48/95.0]
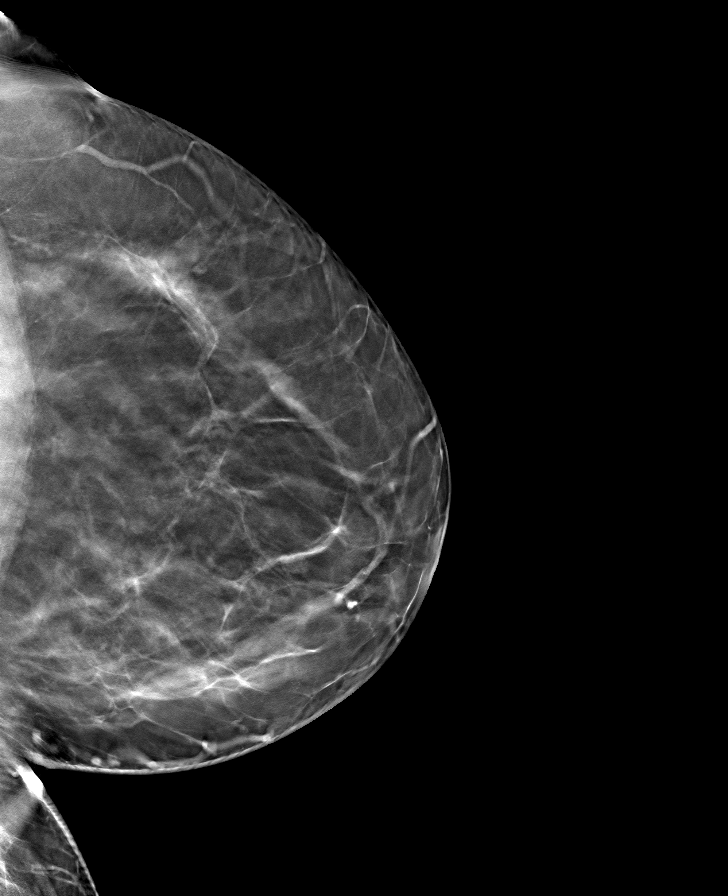

[R MLO tomo · tomo slice 48/95.0]
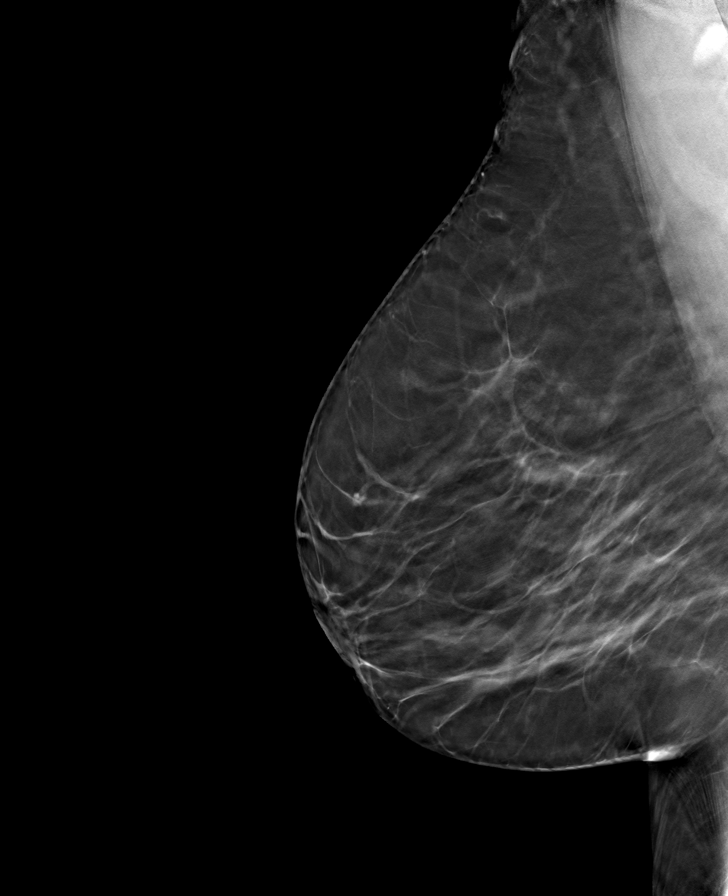

[L MLO tomo · tomo slice 47/94.0]
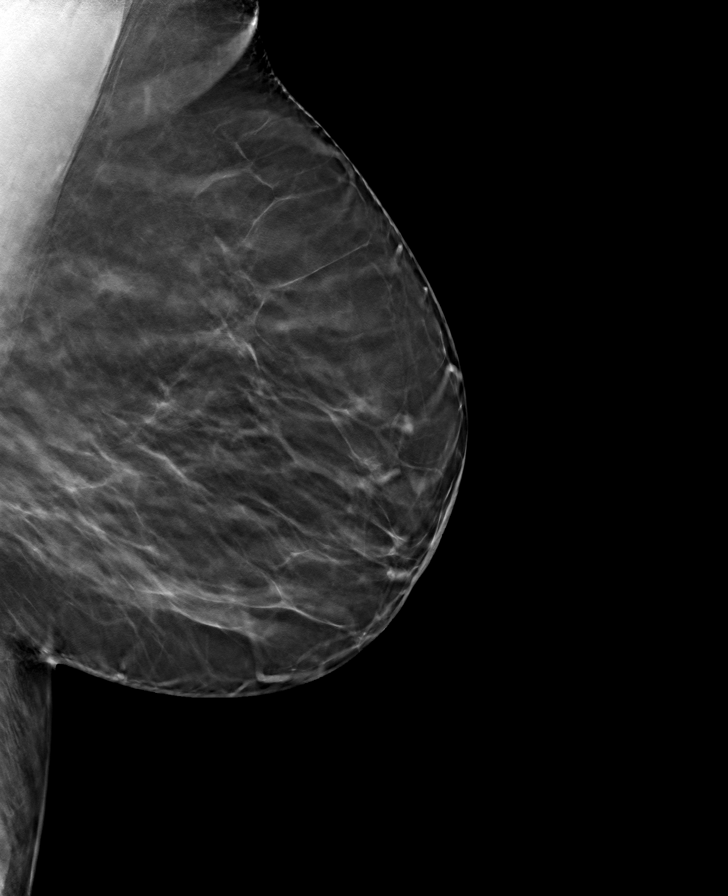

[8 of 24 positions shown; findings below may reference images not displayed]

ACR Breast Density Category b: There are scattered areas of
fibroglandular density.
FINDINGS: There are no findings suspicious for malignancy.
IMPRESSION: No mammographic evidence of malignancy. A result letter of this
screening mammogram will be mailed directly to the patient.

RECOMMENDATION:
Screening mammogram in one year. (Code:51-O-LD2)

BI-RADS CATEGORY  1: Negative.

## 2021-09-21 ENCOUNTER — Other Ambulatory Visit: Payer: Self-pay | Admitting: Family Medicine

## 2021-09-21 DIAGNOSIS — Z1231 Encounter for screening mammogram for malignant neoplasm of breast: Secondary | ICD-10-CM

## 2021-10-31 ENCOUNTER — Ambulatory Visit
Admission: RE | Admit: 2021-10-31 | Discharge: 2021-10-31 | Disposition: A | Discharge status: Home / Self Care | Payer: Medicare Other | Source: Ambulatory Visit | Attending: Family Medicine | Admitting: Family Medicine

## 2021-10-31 DIAGNOSIS — Z1231 Encounter for screening mammogram for malignant neoplasm of breast: Secondary | ICD-10-CM

## 2022-10-17 ENCOUNTER — Other Ambulatory Visit: Payer: Self-pay | Admitting: Family Medicine

## 2022-10-17 DIAGNOSIS — Z1231 Encounter for screening mammogram for malignant neoplasm of breast: Secondary | ICD-10-CM

## 2022-11-08 ENCOUNTER — Ambulatory Visit: Payer: Medicare Other

## 2022-11-12 ENCOUNTER — Ambulatory Visit
Admission: RE | Admit: 2022-11-12 | Discharge: 2022-11-12 | Disposition: A | Discharge status: Home / Self Care | Payer: Medicare Other | Source: Ambulatory Visit | Attending: Family Medicine | Admitting: Family Medicine

## 2022-11-12 DIAGNOSIS — Z1231 Encounter for screening mammogram for malignant neoplasm of breast: Secondary | ICD-10-CM

## 2023-02-06 ENCOUNTER — Other Ambulatory Visit (HOSPITAL_BASED_OUTPATIENT_CLINIC_OR_DEPARTMENT_OTHER): Payer: Self-pay | Admitting: Family Medicine

## 2023-02-06 DIAGNOSIS — E7800 Pure hypercholesterolemia, unspecified: Secondary | ICD-10-CM

## 2023-02-11 ENCOUNTER — Ambulatory Visit (HOSPITAL_COMMUNITY)
Admission: RE | Admit: 2023-02-11 | Discharge: 2023-02-11 | Disposition: A | Discharge status: Home / Self Care | Payer: Medicare Other | Source: Ambulatory Visit | Attending: Family Medicine | Admitting: Family Medicine

## 2023-02-11 DIAGNOSIS — E7800 Pure hypercholesterolemia, unspecified: Secondary | ICD-10-CM | POA: Insufficient documentation

## 2023-09-04 ENCOUNTER — Other Ambulatory Visit: Payer: Self-pay

## 2023-09-04 MED ORDER — WEGOVY 0.25 MG/0.5ML ~~LOC~~ SOAJ
0.2500 mg | SUBCUTANEOUS | 0 refills | Status: AC
  Filled 2023-09-04: qty 2, 28d supply, fill #0

## 2023-09-12 ENCOUNTER — Other Ambulatory Visit: Payer: Self-pay

## 2023-09-25 ENCOUNTER — Other Ambulatory Visit: Payer: Self-pay

## 2023-09-25 MED ORDER — WEGOVY 0.5 MG/0.5ML ~~LOC~~ SOAJ
0.5000 mg | SUBCUTANEOUS | 0 refills | Status: DC
  Filled 2023-09-25: qty 2, 30d supply, fill #0

## 2023-10-14 ENCOUNTER — Other Ambulatory Visit: Payer: Self-pay | Admitting: Family Medicine

## 2023-10-14 DIAGNOSIS — Z1231 Encounter for screening mammogram for malignant neoplasm of breast: Secondary | ICD-10-CM

## 2023-10-24 ENCOUNTER — Other Ambulatory Visit (HOSPITAL_COMMUNITY): Payer: Self-pay

## 2023-10-24 MED ORDER — WEGOVY 0.5 MG/0.5ML ~~LOC~~ SOAJ
0.5000 mg | SUBCUTANEOUS | 0 refills | Status: DC
  Filled 2023-10-24: qty 2, 28d supply, fill #0
  Filled 2023-10-29 – 2023-10-30 (×3): qty 2, 30d supply, fill #0

## 2023-10-26 ENCOUNTER — Other Ambulatory Visit (HOSPITAL_COMMUNITY): Payer: Self-pay

## 2023-10-28 ENCOUNTER — Other Ambulatory Visit (HOSPITAL_COMMUNITY): Payer: Self-pay

## 2023-10-29 ENCOUNTER — Other Ambulatory Visit: Payer: Self-pay

## 2023-10-30 ENCOUNTER — Other Ambulatory Visit: Payer: Self-pay

## 2023-10-31 ENCOUNTER — Other Ambulatory Visit: Payer: Self-pay

## 2023-11-01 ENCOUNTER — Other Ambulatory Visit: Payer: Self-pay

## 2023-11-04 ENCOUNTER — Other Ambulatory Visit: Payer: Self-pay

## 2023-11-13 ENCOUNTER — Ambulatory Visit
Admission: RE | Admit: 2023-11-13 | Discharge: 2023-11-13 | Disposition: A | Discharge status: Home / Self Care | Source: Ambulatory Visit | Attending: Family Medicine | Admitting: Family Medicine

## 2023-11-13 ENCOUNTER — Other Ambulatory Visit: Payer: Self-pay

## 2023-11-13 DIAGNOSIS — Z1231 Encounter for screening mammogram for malignant neoplasm of breast: Secondary | ICD-10-CM

## 2023-11-19 ENCOUNTER — Other Ambulatory Visit (HOSPITAL_COMMUNITY): Payer: Self-pay

## 2023-11-28 ENCOUNTER — Other Ambulatory Visit: Payer: Self-pay

## 2023-11-28 MED ORDER — WEGOVY 0.5 MG/0.5ML ~~LOC~~ SOAJ
0.5000 mg | SUBCUTANEOUS | 0 refills | Status: AC
  Filled 2023-11-28 – 2024-01-06 (×3): qty 2, 28d supply, fill #0

## 2023-12-05 ENCOUNTER — Other Ambulatory Visit: Payer: Self-pay

## 2023-12-06 ENCOUNTER — Other Ambulatory Visit: Payer: Self-pay

## 2024-01-06 ENCOUNTER — Other Ambulatory Visit: Payer: Self-pay
